# Patient Record
Sex: Male | Born: 1990 | Race: White | Hispanic: No | Marital: Single | Smoking: Current every day smoker
Health system: Southern US, Community
[De-identification: ages and names within clinical notes are randomized; demographics above are authoritative.]

## PROBLEM LIST (undated history)

## (undated) DIAGNOSIS — I10 Essential (primary) hypertension: Secondary | ICD-10-CM

---
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
## 2014-02-03 ENCOUNTER — Encounter (HOSPITAL_COMMUNITY): Payer: Self-pay | Admitting: Emergency Medicine

## 2014-02-03 ENCOUNTER — Emergency Department (HOSPITAL_COMMUNITY)
Admission: EM | Admit: 2014-02-03 | Discharge: 2014-02-03 | Disposition: A | Discharge status: Home / Self Care | Payer: BC Managed Care – PPO | Attending: Emergency Medicine | Admitting: Emergency Medicine

## 2014-02-03 DIAGNOSIS — L03319 Cellulitis of trunk, unspecified: Principal | ICD-10-CM

## 2014-02-03 DIAGNOSIS — L02219 Cutaneous abscess of trunk, unspecified: Secondary | ICD-10-CM | POA: Insufficient documentation

## 2014-02-03 DIAGNOSIS — L0291 Cutaneous abscess, unspecified: Secondary | ICD-10-CM

## 2014-02-03 DIAGNOSIS — F17200 Nicotine dependence, unspecified, uncomplicated: Secondary | ICD-10-CM | POA: Insufficient documentation

## 2014-02-03 MED ORDER — IBUPROFEN 800 MG PO TABS: 800.0000 mg | ORAL_TABLET | Freq: Three times a day (TID) | ORAL | Status: AC

## 2014-02-03 MED ORDER — LIDOCAINE-EPINEPHRINE (PF) 1 %-1:200000 IJ SOLN: 10.0000 mL | Freq: Once | INTRAMUSCULAR | Status: DC

## 2014-02-03 MED ORDER — LIDOCAINE HCL (PF) 1 % IJ SOLN
INTRAMUSCULAR | Status: AC
  Filled 2014-02-03: qty 5

## 2014-02-03 NOTE — ED Provider Notes (Signed)
TIME SEEN: 8:20 PM  CHIEF COMPLAINT: Possible abscess  HPI: Patient is a 23 year old male with history of prior abscess who presents emergency department with a small abscess inside of his umbilicus that started 3 days ago. He states he has had some purulent drainage. No fevers, chills, nausea, vomiting or diarrhea. He is diabetic or immunocompromised. No abdominal pain. No dysuria or hematuria.  ROS: See HPI Constitutional: no fever  Eyes: no drainage  ENT: no runny nose   Cardiovascular:  no chest pain  Resp: no SOB  GI: no vomiting GU: no dysuria Integumentary: no rash  Allergy: no hives  Musculoskeletal: no leg swelling  Neurological: no slurred speech ROS otherwise negative  PAST MEDICAL HISTORY/PAST SURGICAL HISTORY:  History reviewed. No pertinent past medical history.  MEDICATIONS:  Prior to Admission medications   Not on File    ALLERGIES:  No Known Allergies  SOCIAL HISTORY:  History  Substance Use Topics  . Smoking status: Current Every Day Smoker  . Smokeless tobacco: Not on file  . Alcohol Use: Yes    FAMILY HISTORY: No family history on file.  EXAM: BP 143/83  Pulse 81  Temp(Src) 98.8 F (37.1 C) (Oral)  Resp 24  Ht 6' (1.829 m)  Wt 210 lb 4.8 oz (95.391 kg)  BMI 28.52 kg/m2  SpO2 98% CONSTITUTIONAL: Alert and oriented and responds appropriately to questions. Well-appearing; well-nourished HEAD: Normocephalic EYES: Conjunctivae clear, PERRL ENT: normal nose; no rhinorrhea; moist mucous membranes; pharynx without lesions noted NECK: Supple, no meningismus, no LAD  CARD: RRR; S1 and S2 appreciated; no murmurs, no clicks, no rubs, no gallops RESP: Normal chest excursion without splinting or tachypnea; breath sounds clear and equal bilaterally; no wheezes, no rhonchi, no rales,  ABD/GI: Normal bowel sounds; non-distended; soft, non-tender, no rebound, no guarding; patient appears to have a small abscess inside of his umbilicus with no active  purulent drainage, area is warm and erythematous and fluctuant BACK:  The back appears normal and is non-tender to palpation, there is no CVA tenderness EXT: Normal ROM in all joints; non-tender to palpation; no edema; normal capillary refill; no cyanosis    SKIN: Normal color for age and race; warm NEURO: Moves all extremities equally PSYCH: The patient's mood and manner are appropriate. Grooming and personal hygiene are appropriate.  MEDICAL DECISION MAKING: Patient here with a small abscess inside of his umbilicus. Will I&D. No sign of urine drainage to suggest a remnant tract. No abdominal pain on exam. No systemic symptoms.  ED PROGRESS: Abscess drained with approximately 5 ML of purulent drainage. No signs of surrounding cellulitis. Do not feel he needs to be on empiric antibiotics. Have discussed strict return precautions as supportive care instructions. He verbalizes understanding and is comfortable with plan.    INCISION AND DRAINAGE Performed by: Raelyn NumberWARD, Penina Reisner N Consent: Verbal consent obtained. Risks and benefits: risks, benefits and alternatives were discussed Type: abscess  Body area: Umbilicus  Anesthesia: local infiltration  Incision was made with a scalpel.  Local anesthetic: lidocaine 1 % without epinephrine  Anesthetic total: 5 ml  Complexity: complex Blunt dissection to break up loculations  Drainage: purulent  Drainage amount: 5 MLS    Patient tolerance: Patient tolerated the procedure well with no immediate complications.      Layla MawKristen N Harrietta Incorvaia, DO 02/03/14 2118

## 2014-02-03 NOTE — Discharge Instructions (Signed)

## 2014-02-03 NOTE — ED Notes (Signed)
Patient states he has a boil on his belly button that has come up in the past few days.

## 2014-09-13 ENCOUNTER — Emergency Department (HOSPITAL_COMMUNITY)
Admission: EM | Admit: 2014-09-13 | Discharge: 2014-09-13 | Discharge status: Against Medical Advice | Payer: BLUE CROSS/BLUE SHIELD | Attending: Emergency Medicine | Admitting: Emergency Medicine

## 2014-09-13 ENCOUNTER — Encounter (HOSPITAL_COMMUNITY): Payer: Self-pay | Admitting: Emergency Medicine

## 2014-09-13 DIAGNOSIS — R197 Diarrhea, unspecified: Secondary | ICD-10-CM | POA: Insufficient documentation

## 2014-09-13 DIAGNOSIS — R1110 Vomiting, unspecified: Secondary | ICD-10-CM | POA: Insufficient documentation

## 2014-09-13 NOTE — ED Notes (Signed)
Pt c/o vomiting and diarrhea since yesterday

## 2014-09-13 NOTE — ED Notes (Signed)
Pt left after triage without being seen.

## 2016-12-22 ENCOUNTER — Other Ambulatory Visit: Payer: Self-pay

## 2016-12-22 ENCOUNTER — Emergency Department (HOSPITAL_COMMUNITY): Payer: BLUE CROSS/BLUE SHIELD

## 2016-12-22 ENCOUNTER — Emergency Department (HOSPITAL_COMMUNITY)
Admission: EM | Admit: 2016-12-22 | Discharge: 2016-12-22 | Disposition: A | Discharge status: Home / Self Care | Payer: BLUE CROSS/BLUE SHIELD | Attending: Emergency Medicine | Admitting: Emergency Medicine

## 2016-12-22 ENCOUNTER — Encounter (HOSPITAL_COMMUNITY): Payer: Self-pay | Admitting: Emergency Medicine

## 2016-12-22 DIAGNOSIS — R61 Generalized hyperhidrosis: Secondary | ICD-10-CM | POA: Insufficient documentation

## 2016-12-22 DIAGNOSIS — I10 Essential (primary) hypertension: Secondary | ICD-10-CM | POA: Insufficient documentation

## 2016-12-22 DIAGNOSIS — R110 Nausea: Secondary | ICD-10-CM | POA: Insufficient documentation

## 2016-12-22 DIAGNOSIS — F17210 Nicotine dependence, cigarettes, uncomplicated: Secondary | ICD-10-CM | POA: Insufficient documentation

## 2016-12-22 DIAGNOSIS — R1013 Epigastric pain: Secondary | ICD-10-CM | POA: Diagnosis not present

## 2016-12-22 DIAGNOSIS — R1011 Right upper quadrant pain: Secondary | ICD-10-CM | POA: Diagnosis present

## 2016-12-22 HISTORY — DX: Essential (primary) hypertension: I10

## 2016-12-22 LAB — COMPREHENSIVE METABOLIC PANEL
ALT: 38 U/L (ref 17–63)
AST: 27 U/L (ref 15–41)
Albumin: 3.9 g/dL (ref 3.5–5.0)
Alkaline Phosphatase: 71 U/L (ref 38–126)
Anion gap: 7 (ref 5–15)
BUN: 10 mg/dL (ref 6–20)
CHLORIDE: 111 mmol/L (ref 101–111)
CO2: 23 mmol/L (ref 22–32)
Calcium: 9.5 mg/dL (ref 8.9–10.3)
Creatinine, Ser: 0.85 mg/dL (ref 0.61–1.24)
GFR calc Af Amer: >60 mL/min (ref >60)
GFR calc non Af Amer: >60 mL/min (ref >60)
Glucose, Bld: 138 mg/dL — ABNORMAL HIGH (ref 65–99)
Potassium: 3.7 mmol/L (ref 3.5–5.1)
Sodium: 141 mmol/L (ref 135–145)
Total Bilirubin: 0.4 mg/dL (ref 0.3–1.2)
Total Protein: 6.7 g/dL (ref 6.5–8.1)

## 2016-12-22 LAB — CBC
HCT: 39 % (ref 39.0–52.0)
HEMOGLOBIN: 13.1 g/dL (ref 13.0–17.0)
MCH: 30.8 pg (ref 26.0–34.0)
MCHC: 33.6 g/dL (ref 30.0–36.0)
MCV: 91.5 fL (ref 78.0–100.0)
PLATELETS: 155 10*3/uL (ref 150–400)
RBC: 4.26 MIL/uL (ref 4.22–5.81)
RDW: 13.6 % (ref 11.5–15.5)
WBC: 11.5 10*3/uL — ABNORMAL HIGH (ref 4.0–10.5)

## 2016-12-22 LAB — LIPASE, BLOOD: Lipase: 34 U/L (ref 11–51)

## 2016-12-22 LAB — TROPONIN I

## 2016-12-22 MED ORDER — ONDANSETRON HCL 4 MG/2ML IJ SOLN
4.0000 mg | Freq: Once | INTRAMUSCULAR | Status: AC
  Administered 2016-12-22: 4 mg via INTRAVENOUS
  Filled 2016-12-22: qty 2

## 2016-12-22 NOTE — ED Triage Notes (Signed)
Pt reports 90 minutes of feeling like h is heart beating differently, that his BP is high   He reports no  PCP

## 2016-12-22 NOTE — Discharge Instructions (Signed)
Drink clear liquids tonight Start zantac twice daily Come back to ER tomorrow (don't check in as a patient - they will get the ultrasound done when you come to the ER waiting room - let the staff know you are here for an ultrasound).  You will neeed to follow up with your family doctor if your ultrasound was normal  [facility] Doctor List    Kari BaarsEdward Hawkins MD. Specialty: Pulmonary Disease Contact information: [address]  [address]  [place] [address]  [phone]   Syliva OvermanMargaret Simpson, MD. Specialty: [facility] Contact information: [address]  [place] [address]  [phone]   Lilyan PuntScott Luking, MD. Specialty: [facility] Contact information: [address]  [address]  [place] [address]  [phone]   Avon Gullyesfaye Fanta, MD Specialty: Internal Medicine Contact information: [address]  [place] [address]  [phone]   Catalina PizzaZach Hall, MD. Specialty: Internal Medicine Contact information: [address]  [place] [address]  [phone]   Butch PennyAngus Mcinnis, MD. Specialty: Family Medicine Contact information: [address]  [place] [address]  [phone]   John GiovanniStephen Knowlton, MD. Specialty: [facility] Contact information: [address]  [address]  [place] [address]  [phone]   Carylon Perchesoy Fagan, MD. Specialty: Internal Medicine Contact information: [address]  [address]  [place] [address]  [phone]

## 2016-12-22 NOTE — ED Notes (Signed)
Pt currently denies CP

## 2016-12-22 NOTE — ED Provider Notes (Signed)
[facility]EMERGENCY DEPT Provider Note   CSN: [id number] Arrival date & time: 12/22/16  1809     History   Chief Complaint Chief Complaint  Patient presents with  . Chest Pain    HPI Justin Robles is a 26 y.o. male.  HPI  The pt is a 26 y/o male, no prior PMH, takes no daily meds - he smokes and drinks occasionally - he has no hx of exertional sx but after eating today he developed some epigastric and RUQ pain with radiation to the back - this was assocaited with sweating and nausea - no vomiting, no fevers, no cough and no SOB.  It has gradually improved and is currently mild but present.  No exertional sx.  No positional symptoms.  No hx of post prandial pain and no prior abd surgical history.  Past Medical History:  Diagnosis Date  . Hypertension     There are no active problems to display for this patient.   History reviewed. No pertinent surgical history.     Home Medications    Prior to Admission medications   Medication Sig Start Date End Date Taking? Authorizing Provider  ibuprofen (ADVIL,MOTRIN) 800 MG tablet Take 1 tablet (800 mg total) by mouth 3 (three) times daily. 02/03/14   Ward, Layla Maw, DO    Family History History reviewed. No pertinent family history.  Social History Social History  Substance Use Topics  . Smoking status: Current Every Day Smoker    Packs/day: 1.00    Types: Cigarettes  . Smokeless tobacco: Never Used  . Alcohol use Yes     Comment: weekly     Allergies   Patient has no known allergies.   Review of Systems Review of Systems  All other systems reviewed and are negative.    Physical Exam Updated Vital Signs BP 131/87   Pulse (!) 59   Temp 98 F (36.7 C) (Oral)   Resp 19   Ht 6' (1.829 m)   Wt 111.1 kg (245 lb)   SpO2 97%   BMI 33.23 kg/m   Physical Exam  Constitutional: He appears well-developed and well-nourished. No distress.  HENT:  Head: Normocephalic and atraumatic.  Mouth/Throat: Oropharynx is  clear and moist. No oropharyngeal exudate.  Eyes: Conjunctivae and EOM are normal. Pupils are equal, round, and reactive to light. Right eye exhibits no discharge. Left eye exhibits no discharge. No scleral icterus.  Neck: Normal range of motion. Neck supple. No JVD present. No thyromegaly present.  Cardiovascular: Normal rate, regular rhythm, normal heart sounds and intact distal pulses.  Exam reveals no gallop and no friction rub.   No murmur heard. Pulmonary/Chest: Effort normal and breath sounds normal. No respiratory distress. He has no wheezes. He has no rales. He exhibits no tenderness.  Abdominal: Soft. Bowel sounds are normal. He exhibits no distension and no mass. There is tenderness ( mild ttp in the epigastrium and the RUQ - no other ttp no murphy's sign.).  Musculoskeletal: Normal range of motion. He exhibits no edema or tenderness.  Lymphadenopathy:    He has no cervical adenopathy.  Neurological: He is alert. Coordination normal.  Skin: Skin is warm and dry. No rash noted. No erythema.  Psychiatric: He has a normal mood and affect. His behavior is normal.  Nursing note and vitals reviewed.    ED Treatments / Results  Labs (all labs ordered are listed, but only abnormal results are displayed) Labs Reviewed  CBC - Abnormal; Notable for the  following:       Result Value   WBC 11.5 (*)    All other components within normal limits  COMPREHENSIVE METABOLIC PANEL - Abnormal; Notable for the following:    Glucose, Bld 138 (*)    All other components within normal limits  TROPONIN I  LIPASE, BLOOD    EKG  EKG Interpretation None       Radiology Dg Chest 2 View  Result Date: 12/22/2016 CLINICAL DATA:  Acute onset chest and epigastric pain 2 hours ago. Nausea and vomiting and diaphoresis. EXAM: CHEST  2 VIEW COMPARISON:  None. FINDINGS: The heart size and mediastinal contours are within normal limits. Both lungs are clear. The visualized skeletal structures are  unremarkable. IMPRESSION: Negative.  No active cardiopulmonary disease. Electronically Signed   By: Myles RosenthalJohn  Stahl M.D.   On: 12/22/2016 18:35    Procedures Procedures (including critical care time)  Medications Ordered in ED Medications  ondansetron (ZOFRAN) injection 4 mg (4 mg Intravenous Given 12/22/16 1919)     Initial Impression / Assessment and Plan / ED Course  I have reviewed the triage vital signs and the nursing notes.  Pertinent labs & imaging results that were available during my care of the patient were reviewed by me and considered in my medical decision making (see chart for details).    Check for LFT / lipase / trop - pt is gradually improving  NPO  Labs normal other than mild luekocytosis. CXR normal Pt stable appaering for d/c Come back in AM for US Pt agreeable Minimal ttp on repeat exam.  ED ECG REPORT  I personally interpreted this EKG   Date: 12/22/2016   Rate: 63  Rhythm: normal sinus rhythm  QRS Axis: normal  Intervals: normal  ST/T Wave abnormalities: normal  Conduction Disutrbances:none  Narrative Interpretation:   Old EKG Reviewed: none available   Final Clinical Impressions(s) / ED Diagnoses   Final diagnoses:  Epigastric pain    New Prescriptions New Prescriptions   No medications on file     Eber HongMiller, Seve Monette, MD 12/22/16 2013

## 2016-12-22 NOTE — ED Triage Notes (Signed)
Pt reports having epigastric/chest pain about an hour and a half ago while lying on couch.  States pain went straight through to back and he was nauseated, vomiting, and sweating.  States pain much better currently.

## 2016-12-23 ENCOUNTER — Ambulatory Visit (HOSPITAL_COMMUNITY)
Admit: 2016-12-23 | Discharge: 2016-12-23 | Disposition: A | Discharge status: Home / Self Care | Payer: BLUE CROSS/BLUE SHIELD | Attending: Emergency Medicine | Admitting: Emergency Medicine

## 2016-12-23 DIAGNOSIS — R1011 Right upper quadrant pain: Secondary | ICD-10-CM | POA: Diagnosis present

## 2016-12-23 DIAGNOSIS — K820 Obstruction of gallbladder: Secondary | ICD-10-CM | POA: Insufficient documentation

## 2016-12-23 NOTE — ED Provider Notes (Signed)
Ultrasound of right upper quadrant shows no gallbladder disease. This was discussed with the patient.   Donnetta Hutchingook, Penni Penado, MD 12/23/16 1154

## 2018-08-26 ENCOUNTER — Emergency Department (HOSPITAL_COMMUNITY): Payer: BLUE CROSS/BLUE SHIELD

## 2018-08-26 ENCOUNTER — Other Ambulatory Visit: Payer: Self-pay

## 2018-08-26 ENCOUNTER — Encounter (HOSPITAL_COMMUNITY): Payer: Self-pay

## 2018-08-26 DIAGNOSIS — F17210 Nicotine dependence, cigarettes, uncomplicated: Secondary | ICD-10-CM | POA: Diagnosis not present

## 2018-08-26 DIAGNOSIS — Y929 Unspecified place or not applicable: Secondary | ICD-10-CM | POA: Diagnosis not present

## 2018-08-26 DIAGNOSIS — S61242A Puncture wound with foreign body of right middle finger without damage to nail, initial encounter: Secondary | ICD-10-CM | POA: Diagnosis not present

## 2018-08-26 DIAGNOSIS — I10 Essential (primary) hypertension: Secondary | ICD-10-CM | POA: Insufficient documentation

## 2018-08-26 DIAGNOSIS — Y939 Activity, unspecified: Secondary | ICD-10-CM | POA: Diagnosis not present

## 2018-08-26 DIAGNOSIS — S6991XA Unspecified injury of right wrist, hand and finger(s), initial encounter: Secondary | ICD-10-CM | POA: Diagnosis present

## 2018-08-26 DIAGNOSIS — Z23 Encounter for immunization: Secondary | ICD-10-CM | POA: Diagnosis not present

## 2018-08-26 DIAGNOSIS — W34010A Accidental discharge of airgun, initial encounter: Secondary | ICD-10-CM | POA: Diagnosis not present

## 2018-08-26 DIAGNOSIS — Y999 Unspecified external cause status: Secondary | ICD-10-CM | POA: Diagnosis not present
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
## 2018-08-27 ENCOUNTER — Emergency Department (HOSPITAL_COMMUNITY)
Admission: EM | Admit: 2018-08-27 | Discharge: 2018-08-27 | Disposition: A | Discharge status: Home / Self Care | Payer: BLUE CROSS/BLUE SHIELD | Attending: Emergency Medicine | Admitting: Emergency Medicine

## 2018-08-27 DIAGNOSIS — S60459A Superficial foreign body of unspecified finger, initial encounter: Secondary | ICD-10-CM

## 2018-08-27 MED ORDER — TETANUS-DIPHTH-ACELL PERTUSSIS 5-2.5-18.5 LF-MCG/0.5 IM SUSP
0.5000 mL | Freq: Once | INTRAMUSCULAR | Status: AC
  Administered 2018-08-27: 0.5 mL via INTRAMUSCULAR
  Filled 2018-08-27: qty 0.5

## 2018-08-27 MED ORDER — AMOXICILLIN-POT CLAVULANATE 875-125 MG PO TABS
1.0000 | ORAL_TABLET | Freq: Once | ORAL | Status: AC
  Administered 2018-08-27: 1 via ORAL
  Filled 2018-08-27: qty 1

## 2018-08-27 MED ORDER — AMOXICILLIN-POT CLAVULANATE 875-125 MG PO TABS: 1.0000 | ORAL_TABLET | Freq: Two times a day (BID) | ORAL | 0 refills | Status: AC
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
## 2018-08-27 NOTE — Discharge Instructions (Addendum)
Keep the wound clean and dry.  Take the antibiotics as prescribed.  Follow-up with a hand doctor Dr. Amanda Pea at 2 pm tomorrow.  Return to the ED with worsening pain, numbness, tingling or any other concerns.

## 2018-09-01 IMAGING — US US ABDOMEN LIMITED
1 series · 14 of 25 positions shown · non-contrast
Comparison: None.

CLINICAL DATA: 25-year-old male with right upper quadrant abdominal
pain for 1 day.

EXAM:
US ABDOMEN LIMITED - RIGHT UPPER QUADRANT

[Series 1: us abdomen limited · 0.21mm/px · 14 of 50 slices shown]
[im 1/50]
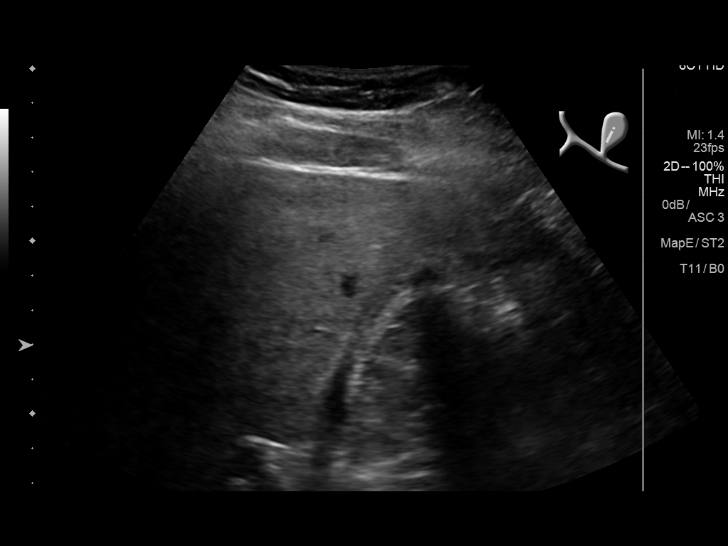
[im 5/50]
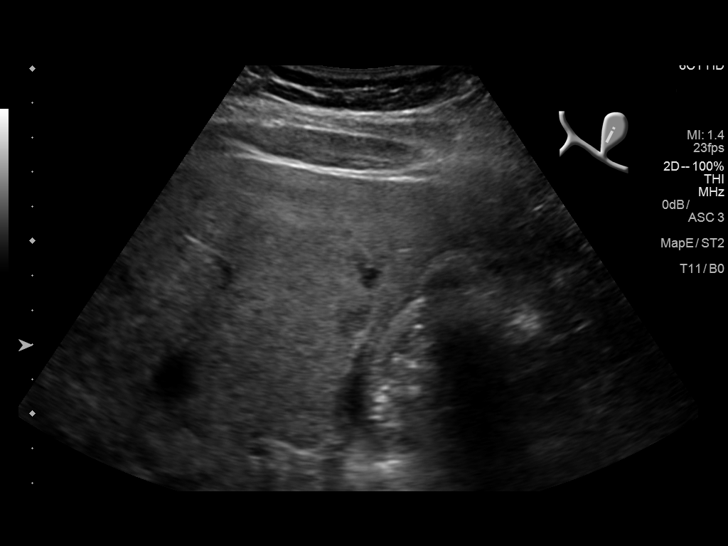
[im 9/50]
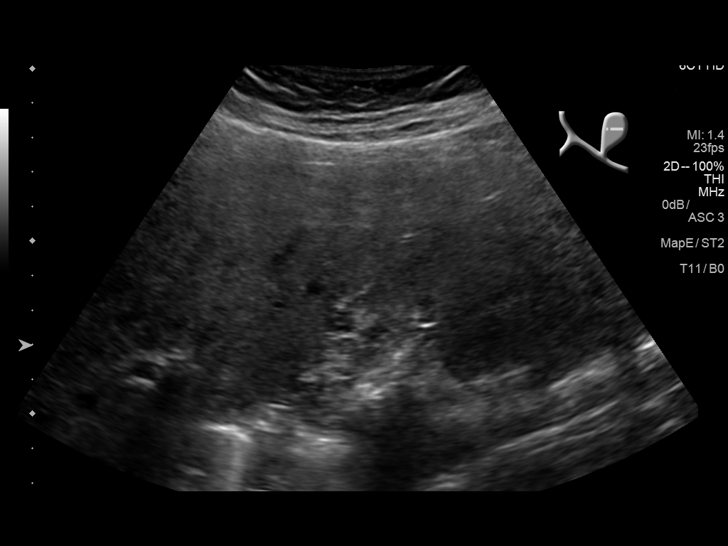
[im 13/50]
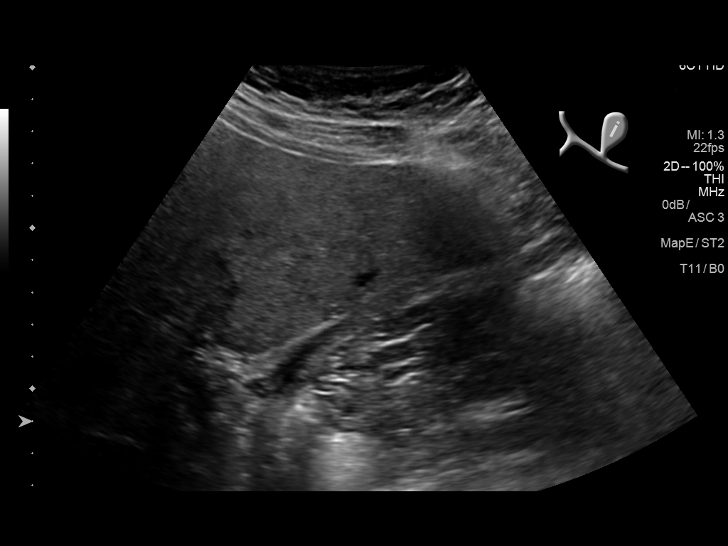
[im 17/50]
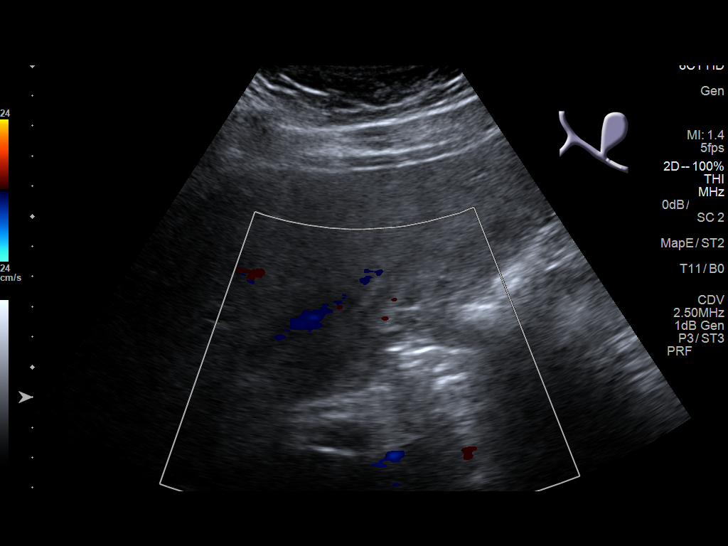
[im 19/50]
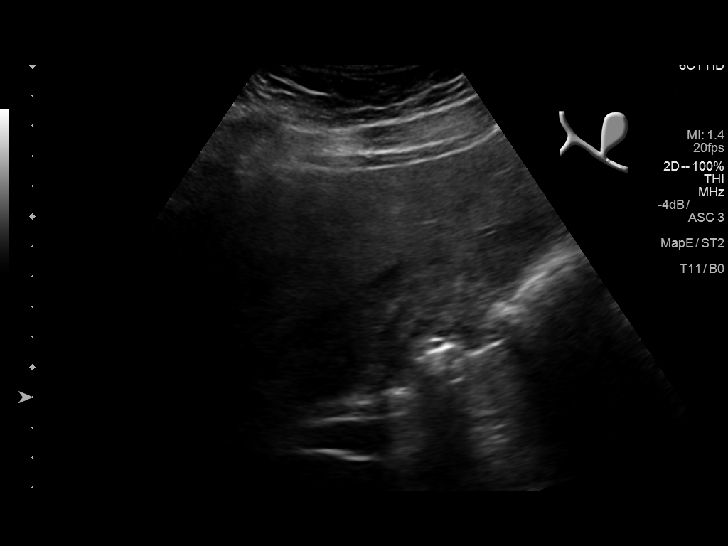
[im 23/50]
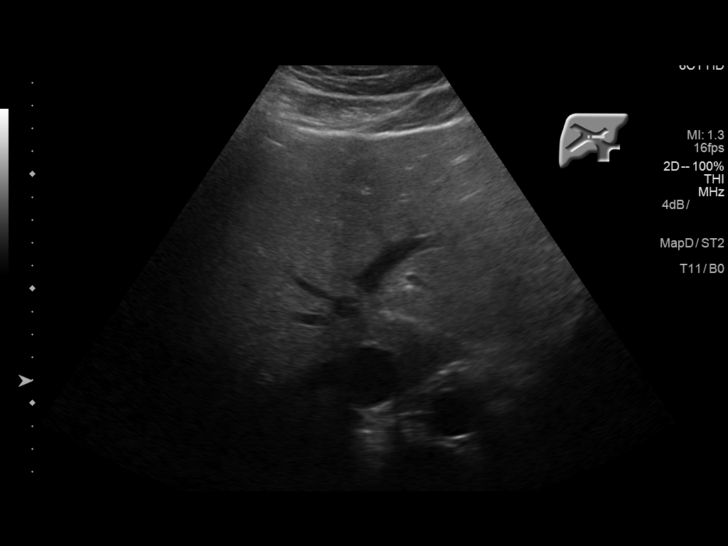
[im 27/50]
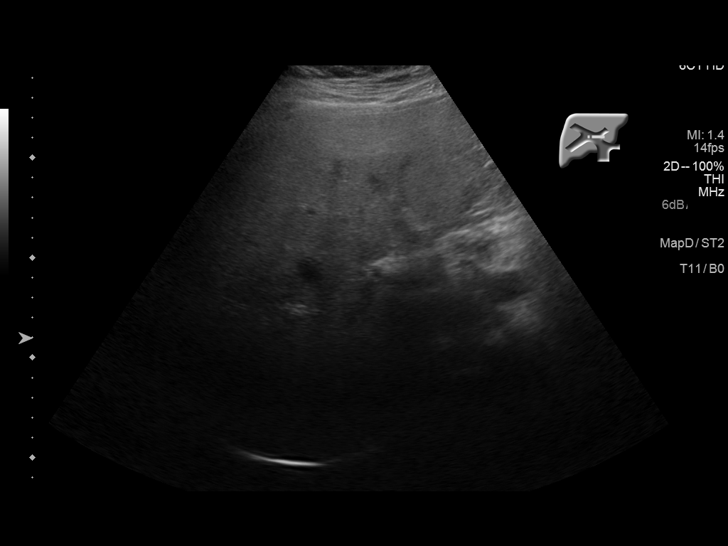
[im 31/50]
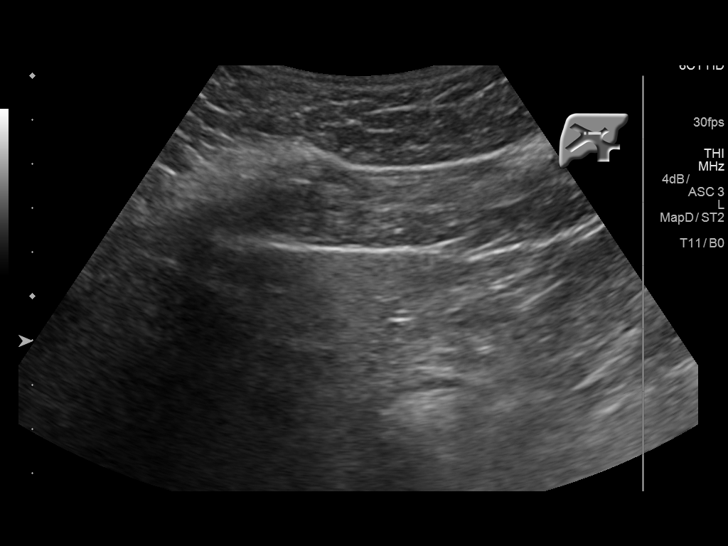
[im 33/50]
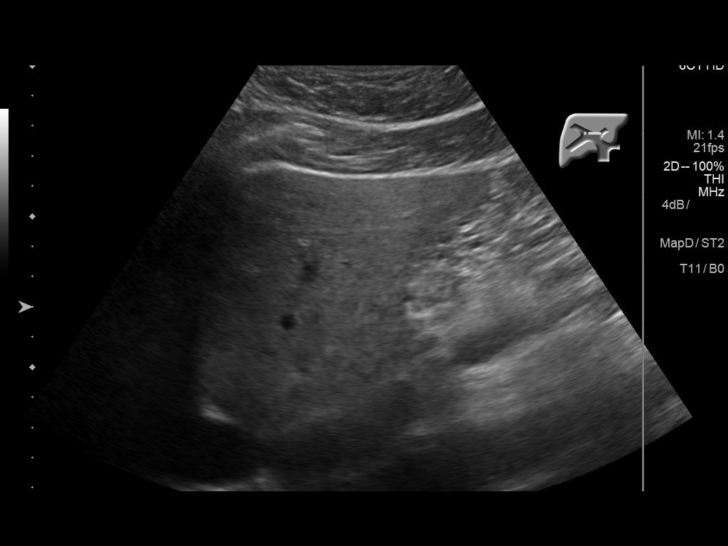
[im 37/50]
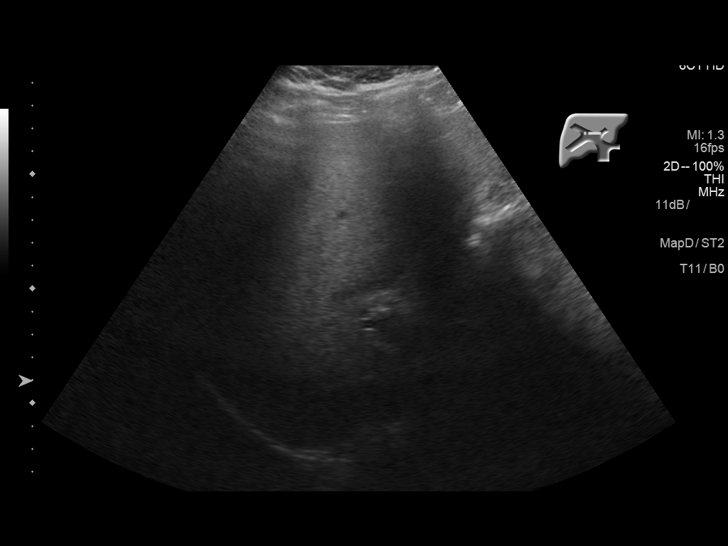
[im 41/50]
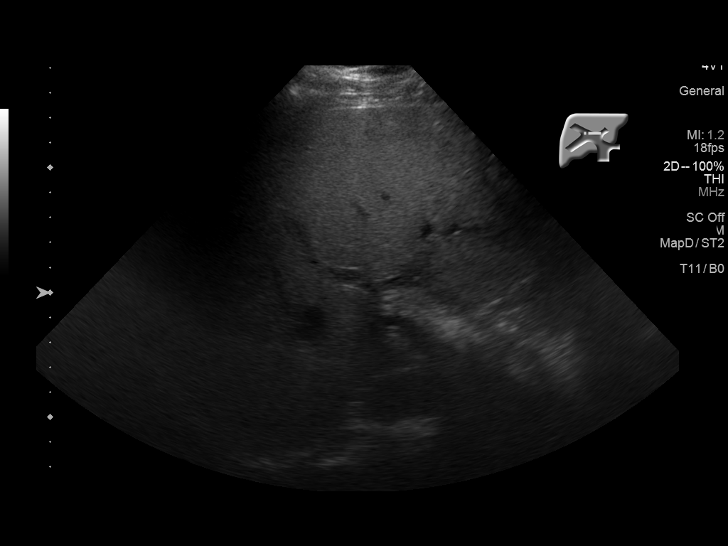
[im 45/50]
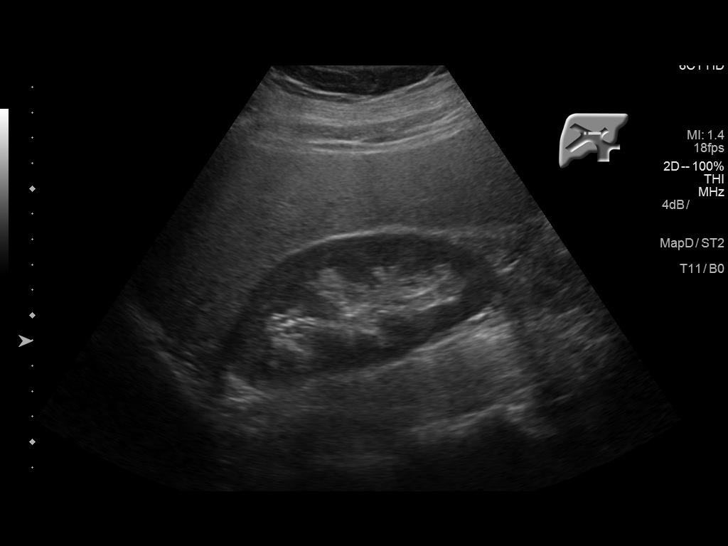
[im 50/50]
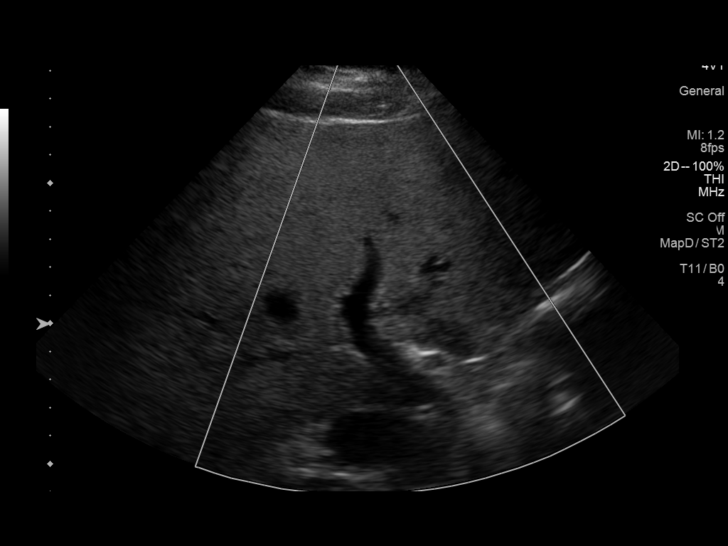

[14 of 25 positions shown; findings below may reference images not displayed]

FINDINGS: Gallbladder:

The gallbladder is contracted. There is no evidence of
cholelithiasis, pericholecystic fluid or sonographic Murphy's sign.

Common bile duct:

Diameter: 4 mm. There is no evidence of intrahepatic or extrahepatic
biliary dilatation.

Liver:

No focal lesion identified. Within normal limits in parenchymal
echogenicity.
IMPRESSION: No evidence of acute abnormality.

Contracted gallbladder without cholelithiasis or evidence of acute
cholecystitis.

## 2018-11-24 ENCOUNTER — Other Ambulatory Visit: Payer: Self-pay

## 2018-11-24 ENCOUNTER — Emergency Department (HOSPITAL_COMMUNITY)
Admission: EM | Admit: 2018-11-24 | Discharge: 2018-11-24 | Disposition: A | Discharge status: Home / Self Care | Payer: BLUE CROSS/BLUE SHIELD | Attending: Emergency Medicine | Admitting: Emergency Medicine

## 2018-11-24 ENCOUNTER — Encounter (HOSPITAL_COMMUNITY): Payer: Self-pay | Admitting: Emergency Medicine

## 2018-11-24 DIAGNOSIS — R0981 Nasal congestion: Secondary | ICD-10-CM | POA: Diagnosis present

## 2018-11-24 DIAGNOSIS — R05 Cough: Secondary | ICD-10-CM | POA: Diagnosis not present

## 2018-11-24 DIAGNOSIS — F17210 Nicotine dependence, cigarettes, uncomplicated: Secondary | ICD-10-CM | POA: Diagnosis not present

## 2018-11-24 DIAGNOSIS — I10 Essential (primary) hypertension: Secondary | ICD-10-CM | POA: Diagnosis not present

## 2018-11-24 DIAGNOSIS — R059 Cough, unspecified: Secondary | ICD-10-CM

## 2018-11-24 NOTE — ED Triage Notes (Signed)
PT states 3 days with sinus congestion with no fever and occasional cough with green sputum. PT states when he woke up this am he was coughing and cleared a lot of mucous and wanted to come to the ED to make sure he doesn't have Covid before going to work today. PT denies any SOB at this time and denies any travel or recent exposure to anyone who is positive.

## 2018-11-28 NOTE — ED Provider Notes (Signed)
[facility] EMERGENCY DEPARTMENT Provider Note   CSN: [id number] Arrival date & time: 11/24/18  0710    History   Chief Complaint Chief Complaint  Patient presents with  . Nasal Congestion    HPI KENTAVIOUS GRABINSKI is a 28 y.o. male.     HPI   28 year old male with cough and congestion.  Began 3 days ago.  Woke up this morning coughing a lot but feels much better actually feel like it mobilized it.  Does not feel short of breath.  No fevers.  No unusual leg pain or swelling.  No sick contacts that he is aware of.  Is concerned about possible COVID.  Past Medical History:  Diagnosis Date  . Hypertension     There are no active problems to display for this patient.   History reviewed. No pertinent surgical history.      Home Medications    Prior to Admission medications   Medication Sig Start Date End Date Taking? Authorizing Provider  amoxicillin-clavulanate (AUGMENTIN) 875-125 MG tablet Take 1 tablet by mouth every 12 (twelve) hours. 08/27/18   Rancour, Jeannett Senior, MD  ibuprofen (ADVIL,MOTRIN) 800 MG tablet Take 1 tablet (800 mg total) by mouth 3 (three) times daily. 02/03/14   Ward, Layla Maw, DO    Family History History reviewed. No pertinent family history.  Social History Social History   Tobacco Use  . Smoking status: Current Every Day Smoker    Packs/day: 1.00    Types: Cigarettes  . Smokeless tobacco: Never Used  Substance Use Topics  . Alcohol use: Yes    Comment: weekly  . Drug use: No     Allergies   Patient has no known allergies.   Review of Systems Review of Systems All systems reviewed and negative, other than as noted in HPI.   Physical Exam Updated Vital Signs BP (!) 133/98 (BP Location: Left Arm)   Pulse 94   Temp 97.7 F (36.5 C) (Oral)   Resp 18   Ht 6' (1.829 m)   Wt 106.6 kg   SpO2 100%   BMI 31.87 kg/m   Physical Exam Vitals signs and nursing note reviewed.  Constitutional:      General: He is not in acute  distress.    Appearance: He is well-developed.  HENT:     Head: Normocephalic and atraumatic.  Eyes:     General:        Right eye: No discharge.        Left eye: No discharge.     Conjunctiva/sclera: Conjunctivae normal.  Neck:     Musculoskeletal: Neck supple.  Cardiovascular:     Rate and Rhythm: Normal rate and regular rhythm.     Heart sounds: Normal heart sounds. No murmur. No friction rub. No gallop.   Pulmonary:     Effort: Pulmonary effort is normal. No respiratory distress.     Breath sounds: Normal breath sounds.  Abdominal:     General: There is no distension.     Palpations: Abdomen is soft.     Tenderness: There is no abdominal tenderness.  Musculoskeletal:        General: No tenderness.     Comments: Lower extremities symmetric as compared to each other. No calf tenderness. Negative Homan's. No palpable cords.   Skin:    General: Skin is warm and dry.  Neurological:     Mental Status: He is alert.  Psychiatric:        Behavior: Behavior normal.  Thought Content: Thought content normal.      ED Treatments / Results  Labs (all labs ordered are listed, but only abnormal results are displayed) Labs Reviewed - No data to display  EKG None  Radiology No results found.  Procedures Procedures (including critical care time)  Medications Ordered in ED Medications - No data to display   Initial Impression / Assessment and Plan / ED Course  I have reviewed the triage vital signs and the nursing notes.  Pertinent labs & imaging results that were available during my care of the patient were reviewed by me and considered in my medical decision making (see chart for details).        28 year old male with what I suspect is a mild URI versus seasonal allergies.  I doubt serious bacterial infection.  Doubt COVID.  I do not feel he needs testing.  He does not feel short of breath.  O2 sats are normal.  Lungs are clear on exam.  Reassured.  Return  precautions were discussed.  Julaine HuaCharlie J Heiler was evaluated in Emergency Department on 11/28/2018 for the symptoms described in the history of present illness. He was evaluated in the context of the global COVID-19 pandemic, which necessitated consideration that the patient might be at risk for infection with the SARS-CoV-2 virus that causes COVID-19. Institutional protocols and algorithms that pertain to the evaluation of patients at risk for COVID-19 are in a state of rapid change based on information released by regulatory bodies including the CDC and federal and state organizations. These policies and algorithms were followed during the patient's care in the ED.   Final Clinical Impressions(s) / ED Diagnoses   Final diagnoses:  Cough    ED Discharge Orders    None       Raeford RazorKohut, Leonidus Rowand, MD 11/28/18 1606
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
